# Patient Record
Sex: Male | Born: 1960 | Race: White | Hispanic: No | Marital: Single
Health system: Southern US, Community
[De-identification: ages and names within clinical notes are randomized; demographics above are authoritative.]

---

## 2004-03-27 ENCOUNTER — Emergency Department (HOSPITAL_COMMUNITY): Admission: EM | Admit: 2004-03-27 | Discharge: 2004-03-27 | Payer: Self-pay

## 2006-05-29 IMAGING — CR DG CHEST 2V
2 series · 2 of 2 positions shown · non-contrast
Comparison: none

CLINICAL DATA: Mid chest pain and shortness of breath since last night. 
 CHEST - TWO VIEW:
 No priors for comparison. 
 Peribronchial thickening mainly lower lung zones compatible with bronchitic changes, likely at least in part chronic.  Active bronchitis may be present as well.  No infiltrate.  Normal cardiac size and shape.

[view not recorded (1 of 2)]
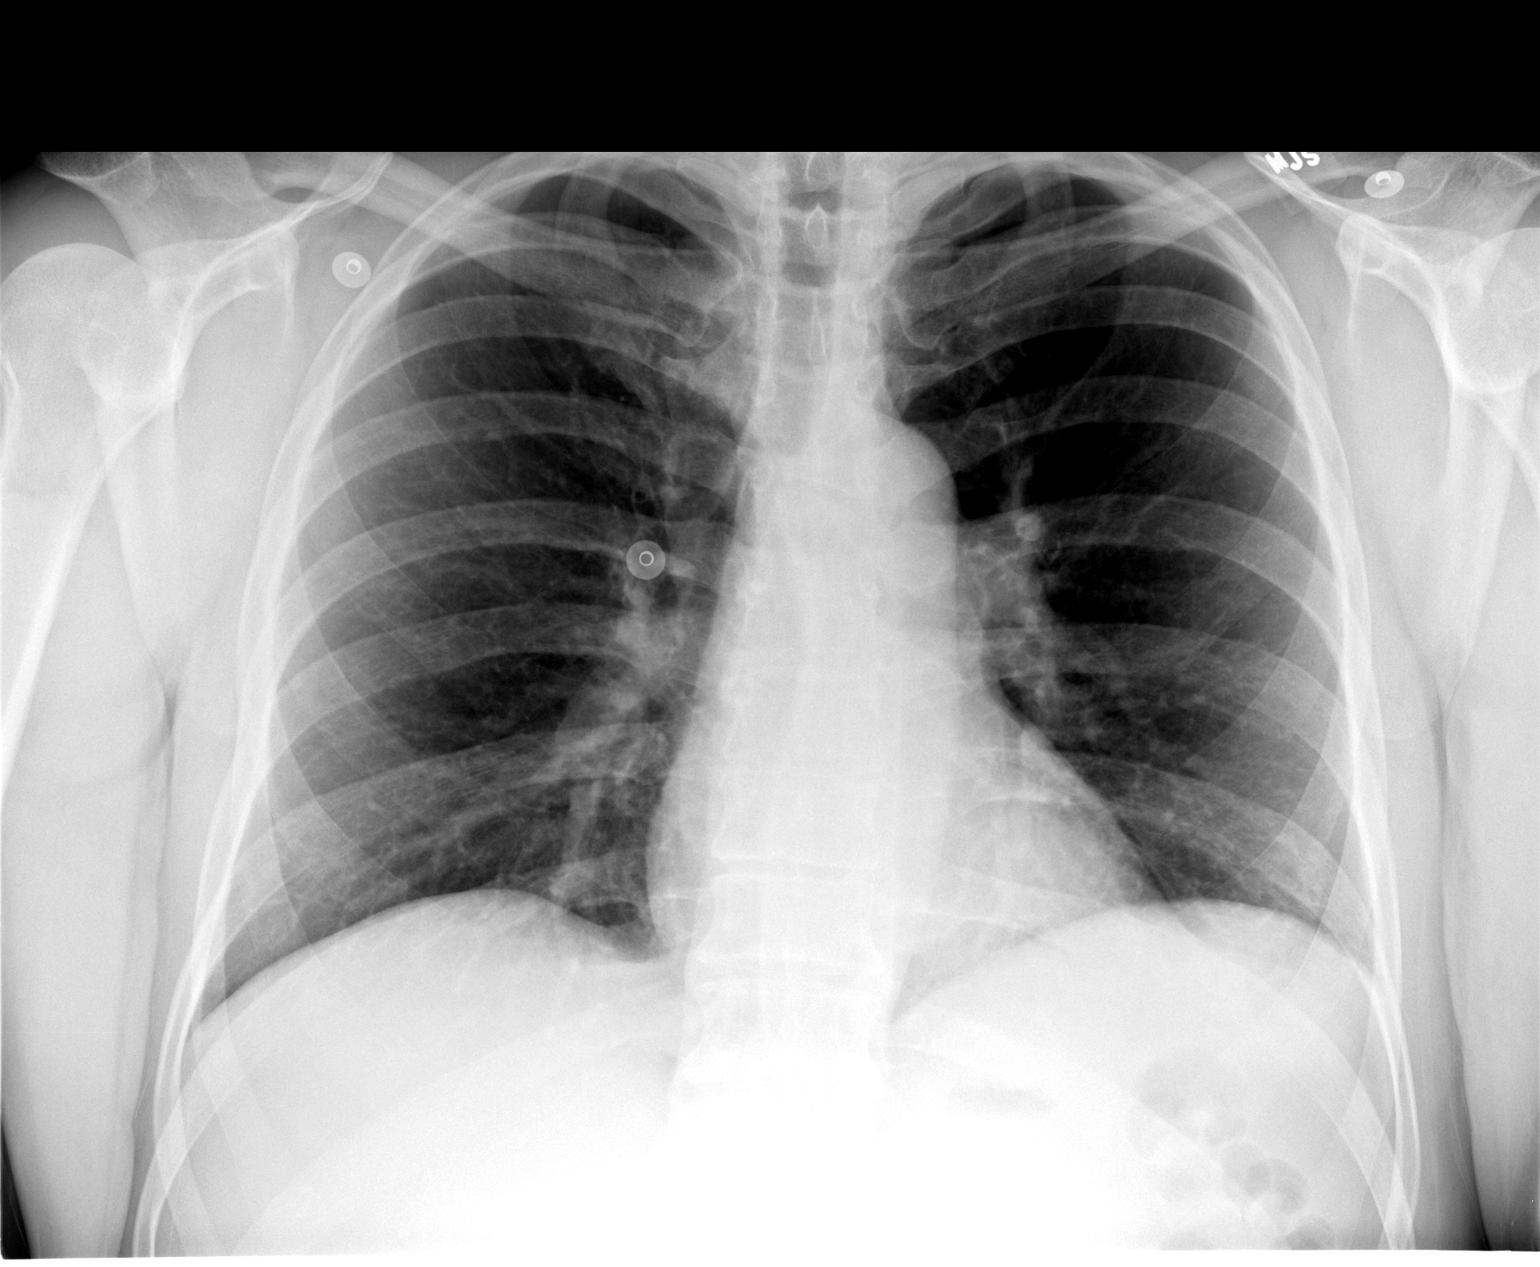

[view not recorded (2 of 2)]
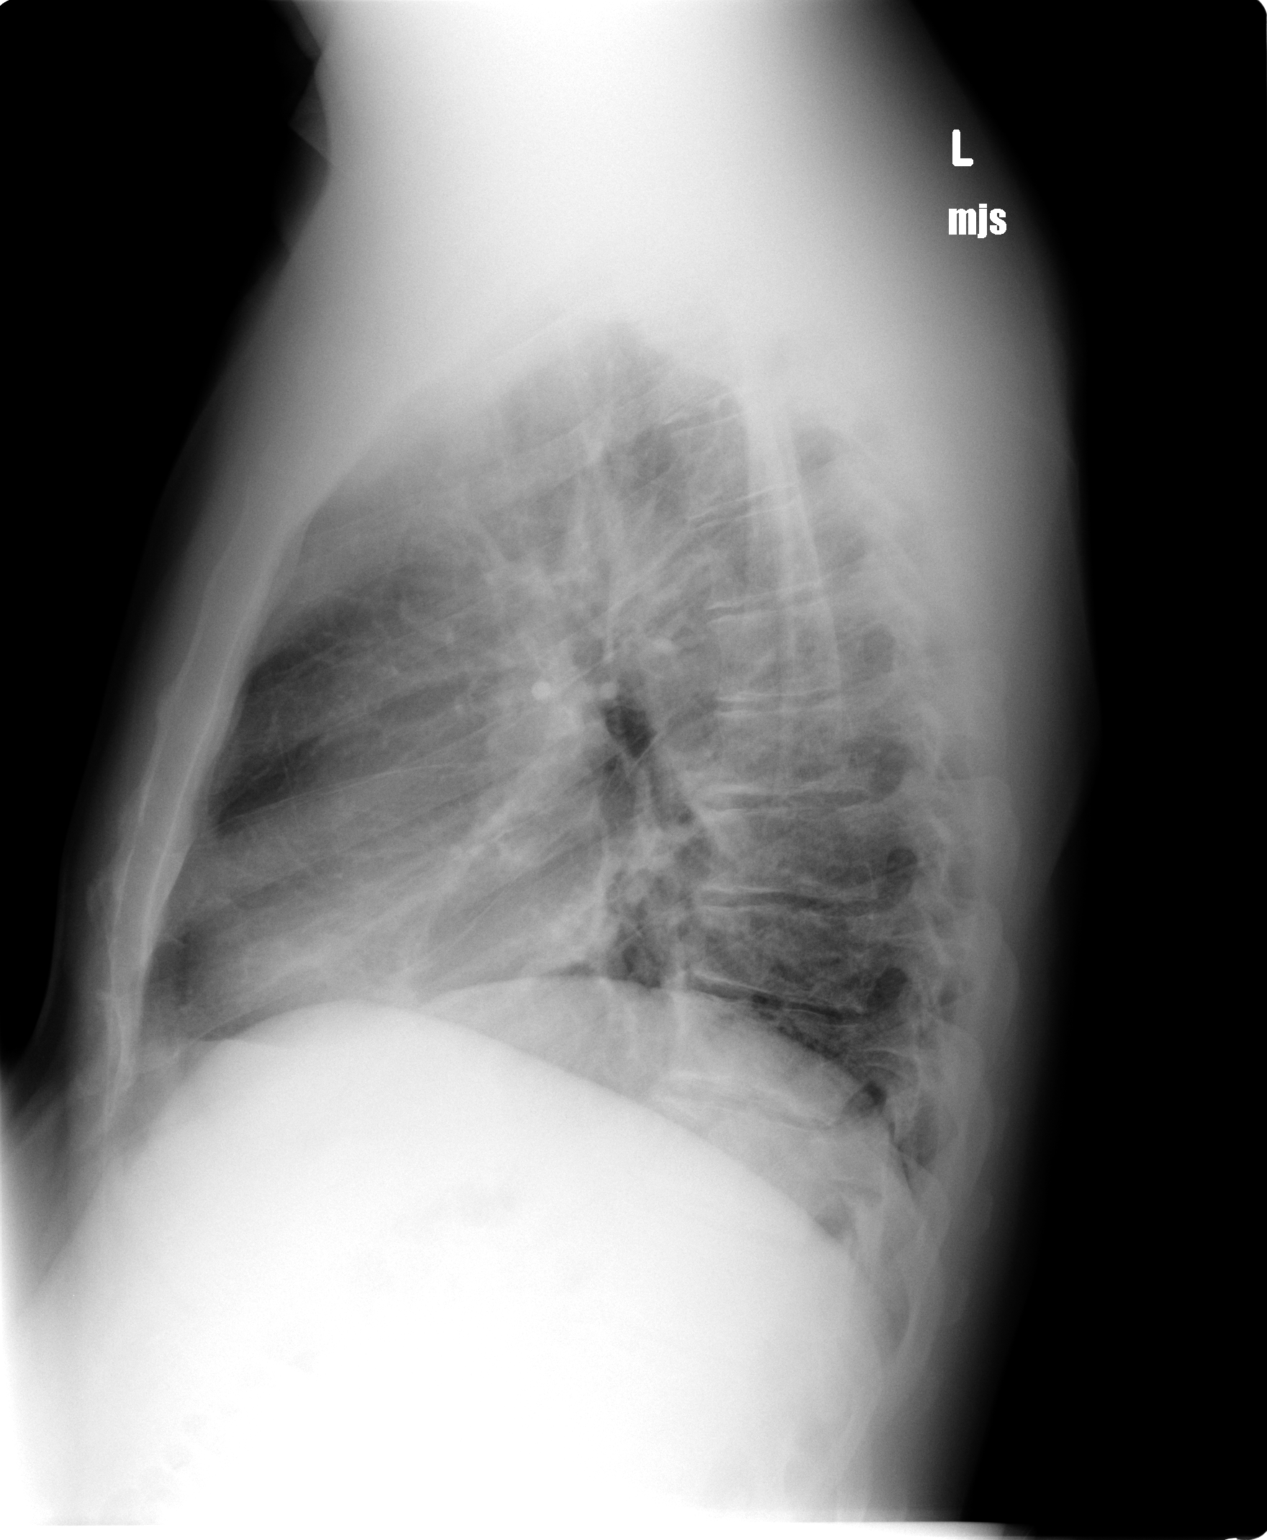

[2 of 2 positions shown; findings below may reference images not displayed]

IMPRESSION: Bronchitic changes.

## 2019-06-09 ENCOUNTER — Ambulatory Visit: Payer: Self-pay | Attending: Internal Medicine

## 2019-06-09 DIAGNOSIS — Z23 Encounter for immunization: Secondary | ICD-10-CM | POA: Insufficient documentation

## 2019-06-09 NOTE — Progress Notes (Signed)
   Covid-[facility] Vaccination [facility]  Name:  Roger Garcia    MRN: [id number] DOB: 1960/08/18  06/09/2019  Roger Garcia was observed post Covid-19 immunization for 15 minutes without incident. He was provided with Vaccine Information Sheet and instruction to access the V-Safe system.   Roger Garcia was instructed to call 911 with any severe reactions post vaccine: Marland Kitchen Difficulty breathing  . Swelling of face and throat  . A fast heartbeat  . A bad rash all over body  . Dizziness and weakness   Immunizations Administered    Name Date Dose VIS Date Route   Pfizer COVID-19 Vaccine 06/09/2019  9:40 AM 0.3 mL 03/18/2019 Intramuscular   Manufacturer: [employer], [employer]   Lot: [id number]   NDC: [address]-5

## 2019-07-05 ENCOUNTER — Ambulatory Visit: Payer: Self-pay | Attending: Internal Medicine

## 2019-07-05 DIAGNOSIS — Z23 Encounter for immunization: Secondary | ICD-10-CM

## 2019-07-05 NOTE — Progress Notes (Signed)
   Covid-19 Vaccination Clinic  Name:  Kinnick Maus    MRN: [id number] DOB: Aug 09, 1960  07/05/2019  Mr. Abdulaziz was observed post Covid-19 immunization for 15 minutes without incident. He was provided with Vaccine Information Sheet and instruction to access the V-Safe system.   Mr. Reister was instructed to call 911 with any severe reactions post vaccine: Marland Kitchen Difficulty breathing  . Swelling of face and throat  . A fast heartbeat  . A bad rash all over body  . Dizziness and weakness   Immunizations Administered    Name Date Dose VIS Date Route   Pfizer COVID-19 Vaccine 07/05/2019  2:09 PM 0.3 mL 03/18/2019 Intramuscular   Manufacturer: [employer], [employer]   Lot: [id number]   NDC: [address]-2

## 2022-07-07 DEATH — deceased
